# Patient Record
Sex: Male | Born: 1980 | Race: White | Hispanic: No | Marital: Single | Smoking: Never smoker
Health system: Southern US, Community
[De-identification: ages and names within clinical notes are randomized; demographics above are authoritative.]

## PROBLEM LIST (undated history)

## (undated) DIAGNOSIS — Z8619 Personal history of other infectious and parasitic diseases: Secondary | ICD-10-CM

## (undated) HISTORY — PX: WISDOM TOOTH EXTRACTION: SHX21

## (undated) HISTORY — DX: Personal history of other infectious and parasitic diseases: Z86.19

## (undated) HISTORY — PX: OTHER SURGICAL HISTORY: SHX169

---

## 2014-04-08 ENCOUNTER — Ambulatory Visit (HOSPITAL_BASED_OUTPATIENT_CLINIC_OR_DEPARTMENT_OTHER)
Admission: RE | Admit: 2014-04-08 | Discharge: 2014-04-08 | Disposition: A | Discharge status: Home / Self Care | Payer: BLUE CROSS/BLUE SHIELD | Source: Ambulatory Visit | Attending: Physician Assistant | Admitting: Physician Assistant

## 2014-04-08 ENCOUNTER — Encounter: Payer: Self-pay | Admitting: Physician Assistant

## 2014-04-08 ENCOUNTER — Ambulatory Visit (INDEPENDENT_AMBULATORY_CARE_PROVIDER_SITE_OTHER): Payer: BLUE CROSS/BLUE SHIELD | Admitting: Physician Assistant

## 2014-04-08 VITALS — BP 134/75 | HR 80 | Temp 98.1°F | Resp 16 | Ht 69.5 in | Wt 176.4 lb

## 2014-04-08 DIAGNOSIS — F1111 Opioid abuse, in remission: Secondary | ICD-10-CM

## 2014-04-08 DIAGNOSIS — M545 Low back pain, unspecified: Secondary | ICD-10-CM

## 2014-04-08 DIAGNOSIS — F1110 Opioid abuse, uncomplicated: Secondary | ICD-10-CM

## 2014-04-08 DIAGNOSIS — J00 Acute nasopharyngitis [common cold]: Secondary | ICD-10-CM

## 2014-04-08 DIAGNOSIS — J208 Acute bronchitis due to other specified organisms: Secondary | ICD-10-CM

## 2014-04-08 DIAGNOSIS — B9689 Other specified bacterial agents as the cause of diseases classified elsewhere: Secondary | ICD-10-CM

## 2014-04-08 DIAGNOSIS — M25531 Pain in right wrist: Secondary | ICD-10-CM | POA: Diagnosis not present

## 2014-04-08 DIAGNOSIS — G8929 Other chronic pain: Secondary | ICD-10-CM

## 2014-04-08 DIAGNOSIS — Z0000 Encounter for general adult medical examination without abnormal findings: Secondary | ICD-10-CM

## 2014-04-08 LAB — LIPID PANEL
CHOL/HDL RATIO: 3
CHOLESTEROL: 86 mg/dL (ref 0–200)
HDL: 28.3 mg/dL — AB (ref >39.00)
LDL Cholesterol: 36 mg/dL (ref 0–99)
NonHDL: 57.7
TRIGLYCERIDES: 111 mg/dL (ref 0.0–149.0)
VLDL: 22.2 mg/dL (ref 0.0–40.0)

## 2014-04-08 LAB — HEPATIC FUNCTION PANEL
ALT: 75 U/L — ABNORMAL HIGH (ref 0–53)
AST: 96 U/L — AB (ref 0–37)
Albumin: 4.2 g/dL (ref 3.5–5.2)
Alkaline Phosphatase: 54 U/L (ref 39–117)
BILIRUBIN TOTAL: 0.7 mg/dL (ref 0.2–1.2)
Bilirubin, Direct: 0.2 mg/dL (ref 0.0–0.3)
TOTAL PROTEIN: 6.3 g/dL (ref 6.0–8.3)

## 2014-04-08 LAB — CBC
HEMATOCRIT: 48.2 % (ref 39.0–52.0)
Hemoglobin: 16.5 g/dL (ref 13.0–17.0)
MCHC: 34.1 g/dL (ref 30.0–36.0)
MCV: 94.4 fl (ref 78.0–100.0)
Platelets: 224 10*3/uL (ref 150.0–400.0)
RBC: 5.11 Mil/uL (ref 4.22–5.81)
RDW: 14.6 % (ref 11.5–15.5)
WBC: 9.4 10*3/uL (ref 4.0–10.5)

## 2014-04-08 LAB — BASIC METABOLIC PANEL
BUN: 19 mg/dL (ref 6–23)
CO2: 31 mEq/L (ref 19–32)
CREATININE: 0.82 mg/dL (ref 0.40–1.50)
Calcium: 9.3 mg/dL (ref 8.4–10.5)
Chloride: 102 mEq/L (ref 96–112)
GFR: 113.23 mL/min (ref >60.00)
Glucose, Bld: 83 mg/dL (ref 70–99)
Potassium: 4.1 mEq/L (ref 3.5–5.1)
SODIUM: 138 meq/L (ref 135–145)

## 2014-04-08 LAB — TSH: TSH: 1.15 u[IU]/mL (ref 0.35–4.50)

## 2014-04-08 MED ORDER — AZITHROMYCIN 250 MG PO TABS: ORAL_TABLET | ORAL | Status: DC

## 2014-04-08 NOTE — Progress Notes (Signed)
Pre visit review using our clinic review tool, if applicable. No additional management support is needed unless otherwise documented below in the visit note/SLS  

## 2014-04-08 NOTE — Progress Notes (Signed)
Patient presents to clinic today to establish care. Patient requesting CPE and also has acute concerns.  Patient is fasting for labs.  Acute Concerns: Patient complains of intermittent low back pain over the past several months after sustaining an injury in prison.  Patient states pain has not kept him from weightlifting daily. Denies radiation of pain into extremities.  Denies weakness, numbness or tingling of extremities.  Patient also complains of chronic pain of R hand.  Patient sustained significant injury to hand in 2009.  Patient is s/p hand surgery with grafting.  Endorses popping and creaking of wrist and hand since that time with occasional pain. Denies numbness or weakness of hand.  Endorses good drip strength.  Patient also complains of chest congestion and productive cough x 2-3 weeks.  Denies fever, chills or SOB.  Denies pleuritic chest pain.  Is a smoker.   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  Is doing ok overall.  Is trying to avoid situations and people that help perpetuate medication abuse.  Is going to church and feels he has a good support system in place.  Health Maintenance: Dental -- up-to-date Vision -- up-to-date Immunizations -- Declines flu shot.  Tetanus up-to-date 2010.  Past Medical History  Diagnosis Date  . History of chicken pox     Past Surgical History  Procedure Laterality Date  . Wisdom tooth extraction    . Groin flap      No current outpatient prescriptions on file prior to visit.   No current facility-administered medications on file prior to visit.    No Known Allergies  Family History  Problem Relation Age of Onset  . Cancer Mother     Living -- Unsure of type    History   Social History  . Marital Status: Single    Spouse Name: N/A    Number of Children: N/A  . Years of Education: N/A   Occupational History  . Not on file.   Social History Main Topics  . Smoking  status: Never Smoker   . Smokeless tobacco: Never Used  . Alcohol Use: No  . Drug Use: No  . Sexual Activity:    Partners: Female   Other Topics Concern  . Not on file   Social History Narrative   Review of Systems  Constitutional: Negative for fever and weight loss.  HENT: Negative for ear discharge, ear pain, hearing loss and tinnitus.   Eyes: Negative for blurred vision, double vision, photophobia and pain.  Respiratory: Negative for cough and shortness of breath.   Cardiovascular: Negative for chest pain and palpitations.  Gastrointestinal: Negative for heartburn, nausea, vomiting, abdominal pain, diarrhea, constipation, blood in stool and melena.  Genitourinary: Negative for dysuria, urgency, frequency, hematuria and flank pain.  Musculoskeletal: Negative for falls.  Neurological: Negative for dizziness, loss of consciousness and headaches.  Endo/Heme/Allergies: Negative for environmental allergies.  Psychiatric/Behavioral: Negative for depression, suicidal ideas, hallucinations and substance abuse. The patient is not nervous/anxious and does not have insomnia.    BP 134/75 mmHg  Pulse 80  Temp(Src) 98.1 F (36.7 C) (Oral)  Resp 16  Ht 5' 9.5" (1.765 m)  Wt 176 lb 6 oz (80.003 kg)  BMI 25.68 kg/m2  SpO2 98%  Physical Exam  Constitutional: He is oriented to person, place, and time and well-developed, well-nourished, and in no distress.  HENT:  Head: Normocephalic and atraumatic.  Right Ear: External ear normal.  Left Ear: External  ear normal.  Nose: Nose normal.  Mouth/Throat: Oropharynx is clear and moist. No oropharyngeal exudate.  Eyes: Conjunctivae and EOM are normal. Pupils are equal, round, and reactive to light.  Neck: Neck supple. No thyromegaly present.  Cardiovascular: Normal rate, regular rhythm, normal heart sounds and intact distal pulses.   Pulmonary/Chest: Effort normal and breath sounds normal. No respiratory distress. He has no wheezes. He has no  rales. He exhibits no tenderness.  Abdominal: Soft. Bowel sounds are normal. He exhibits no distension and no mass. There is no tenderness. There is no rebound and no guarding.  Genitourinary: Testes/scrotum normal and penis normal. No discharge found.  Musculoskeletal:       Lumbar back: He exhibits tenderness. He exhibits normal range of motion, no bony tenderness, no pain and no spasm.       Right hand: He exhibits deformity. He exhibits normal range of motion, no tenderness and normal capillary refill. Normal sensation noted. Normal strength noted.  Lymphadenopathy:    He has no cervical adenopathy.  Neurological: He is alert and oriented to person, place, and time.  Skin: Skin is warm and dry. No rash noted.  Psychiatric: Affect normal.  Vitals reviewed.  Assessment/Plan: Visit for preventive health examination I have reviewed the patient's medical history in detail and updated the computerized patient record.  Patient declines flu shot.  Endorses Tetanus up-to-date.  Will obtain fasting labs today. Preventive care discussed with patient.  Handout given.   Acute bacterial bronchitis Rx Azithromycin. Increase fluids.  Rest. Mucinex-DM for cough and congestion. Place a humidifier in the bedroom.  Return precautions discussed with patient.   Low back pain of over 3 months duration Will obtain x-ray of lumbar spine to further assess.  Encouraged daily Aleve. Avoid heavy lifting until results can be discussed.  Giving history, will likely need Orthopedic input.   Wrist pain, chronic Will obtain x-ray of R wrist today.  Giving history of significant trauma and surgery, the crepitus is likely secondary to chronic OA.  Supportive measures discussed.  Patient with opioid abuse so narcotics will never been an option with patient at this clinic.  Recommend Hand specialist input.   Opioid abuse, in remission Patient doing well. Will mark chart that patient is a controlled medication caution.   Will not prescribed narcotics to this patient.

## 2014-04-08 NOTE — Patient Instructions (Signed)
Please take antibiotic as directed. Stay well hydrated.  Use Delsym for cough.  Put a humidifier in the bedroom.  Call or return if respiratory symptoms are not improving.  Please stop by the lab for blood work.  I will call you with your results.    Please consider calling the number for Counseling services to set up an appointment with Karna Christmas of [facility] for counseling  Preventive Care for Adults A healthy lifestyle and preventive care can promote health and wellness. Preventive health guidelines for men include the following key practices:  A routine yearly physical is a good way to check with your health care provider about your health and preventative screening. It is a chance to share any concerns and updates on your health and to receive a thorough exam.  Visit your dentist for a routine exam and preventative care every 6 months. Brush your teeth twice a day and floss once a day. Good oral hygiene prevents tooth decay and gum disease.  The frequency of eye exams is based on your age, health, family medical history, use of contact lenses, and other factors. Follow your health care provider's recommendations for frequency of eye exams.  Eat a healthy diet. Foods such as vegetables, fruits, whole grains, low-fat dairy products, and lean protein foods contain the nutrients you need without too many calories. Decrease your intake of foods high in solid fats, added sugars, and salt. Eat the right amount of calories for you.Get information about a proper diet from your health care provider, if necessary.  Regular physical exercise is one of the most important things you can do for your health. Most adults should get at least 150 minutes of moderate-intensity exercise (any activity that increases your heart rate and causes you to sweat) each week. In addition, most adults need muscle-strengthening exercises on 2 or more days a week.  Maintain a healthy weight. The body mass index (BMI) is a screening  tool to identify possible weight problems. It provides an estimate of body fat based on height and weight. Your health care provider can find your BMI and can help you achieve or maintain a healthy weight.For adults 20 years and older:  A BMI below 18.5 is considered underweight.  A BMI of 18.5 to 24.9 is normal.  A BMI of 25 to 29.9 is considered overweight.  A BMI of 30 and above is considered obese.  Maintain normal blood lipids and cholesterol levels by exercising and minimizing your intake of saturated fat. Eat a balanced diet with plenty of fruit and vegetables. Blood tests for lipids and cholesterol should begin at age 30 and be repeated every 5 years. If your lipid or cholesterol levels are high, you are over 50, or you are at high risk for heart disease, you may need your cholesterol levels checked more frequently.Ongoing high lipid and cholesterol levels should be treated with medicines if diet and exercise are not working.  If you smoke, find out from your health care provider how to quit. If you do not use tobacco, do not start.  Lung cancer screening is recommended for adults aged 34-80 years who are at high risk for developing lung cancer because of a history of smoking. A yearly low-dose CT scan of the lungs is recommended for people who have at least a 30-pack-year history of smoking and are a current smoker or have quit within the past 15 years. A pack year of smoking is smoking an average of 1 pack of cigarettes  a day for 1 year (for example: 1 pack a day for 30 years or 2 packs a day for 15 years). Yearly screening should continue until the smoker has stopped smoking for at least 15 years. Yearly screening should be stopped for people who develop a health problem that would prevent them from having lung cancer treatment.  If you choose to drink alcohol, do not have more than 2 drinks per day. One drink is considered to be 12 ounces (355 mL) of beer, 5 ounces (148 mL) of wine, or  1.5 ounces (44 mL) of liquor.  Avoid use of street drugs. Do not share needles with anyone. Ask for help if you need support or instructions about stopping the use of drugs.  High blood pressure causes heart disease and increases the risk of stroke. Your blood pressure should be checked at least every 1-2 years. Ongoing high blood pressure should be treated with medicines, if weight loss and exercise are not effective.  If you are 107-75 years old, ask your health care provider if you should take aspirin to prevent heart disease.  Diabetes screening involves taking a blood sample to check your fasting blood sugar level. This should be done once every 3 years, after age 33, if you are within normal weight and without risk factors for diabetes. Testing should be considered at a younger age or be carried out more frequently if you are overweight and have at least 1 risk factor for diabetes.  Colorectal cancer can be detected and often prevented. Most routine colorectal cancer screening begins at the age of 18 and continues through age 29. However, your health care provider may recommend screening at an earlier age if you have risk factors for colon cancer. On a yearly basis, your health care provider may provide home test kits to check for hidden blood in the stool. Use of a small camera at the end of a tube to directly examine the colon (sigmoidoscopy or colonoscopy) can detect the earliest forms of colorectal cancer. Talk to your health care provider about this at age 83, when routine screening begins. Direct exam of the colon should be repeated every 5-10 years through age 29, unless early forms of precancerous polyps or small growths are found.  People who are at an increased risk for hepatitis B should be screened for this virus. You are considered at high risk for hepatitis B if:  You were born in a country where hepatitis B occurs often. Talk with your health care provider about which countries are  considered high risk.  Your parents were born in a high-risk country and you have not received a shot to protect against hepatitis B (hepatitis B vaccine).  You have HIV or AIDS.  You use needles to inject street drugs.  You live with, or have sex with, someone who has hepatitis B.  You are a man who has sex with other men (MSM).  You get hemodialysis treatment.  You take certain medicines for conditions such as cancer, organ transplantation, and autoimmune conditions.  Hepatitis C blood testing is recommended for all people born from [birth year] through 1965 and any individual with known risks for hepatitis C.  Practice safe sex. Use condoms and avoid high-risk sexual practices to reduce the spread of sexually transmitted infections (STIs). STIs include gonorrhea, chlamydia, syphilis, trichomonas, herpes, HPV, and human immunodeficiency virus (HIV). Herpes, HIV, and HPV are viral illnesses that have no cure. They can result in disability, cancer, and death.  If you are at risk of being infected with HIV, it is recommended that you take a prescription medicine daily to prevent HIV infection. This is called preexposure prophylaxis (PrEP). You are considered at risk if:  You are a man who has sex with other men (MSM) and have other risk factors.  You are a heterosexual man, are sexually active, and are at increased risk for HIV infection.  You take drugs by injection.  You are sexually active with a partner who has HIV.  Talk with your health care provider about whether you are at high risk of being infected with HIV. If you choose to begin PrEP, you should first be tested for HIV. You should then be tested every 3 months for as long as you are taking PrEP.  A one-time screening for abdominal aortic aneurysm (AAA) and surgical repair of large AAAs by ultrasound are recommended for men ages 18 to 34 years who are current or former smokers.  Healthy men should no longer receive  prostate-specific antigen (PSA) blood tests as part of routine cancer screening. Talk with your health care provider about prostate cancer screening.  Testicular cancer screening is not recommended for adult males who have no symptoms. Screening includes self-exam, a health care provider exam, and other screening tests. Consult with your health care provider about any symptoms you have or any concerns you have about testicular cancer.  Use sunscreen. Apply sunscreen liberally and repeatedly throughout the day. You should seek shade when your shadow is shorter than you. Protect yourself by wearing long sleeves, pants, a wide-brimmed hat, and sunglasses year round, whenever you are outdoors.  Once a month, do a whole-body skin exam, using a mirror to look at the skin on your back. Tell your health care provider about new moles, moles that have irregular borders, moles that are larger than a pencil eraser, or moles that have changed in shape or color.  Stay current with required vaccines (immunizations).  Influenza vaccine. All adults should be immunized every year.  Tetanus, diphtheria, and acellular pertussis (Td, Tdap) vaccine. An adult who has not previously received Tdap or who does not know his vaccine status should receive 1 dose of Tdap. This initial dose should be followed by tetanus and diphtheria toxoids (Td) booster doses every 10 years. Adults with an unknown or incomplete history of completing a 3-dose immunization series with Td-containing vaccines should begin or complete a primary immunization series including a Tdap dose. Adults should receive a Td booster every 10 years.  Varicella vaccine. An adult without evidence of immunity to varicella should receive 2 doses or a second dose if he has previously received 1 dose.  Human papillomavirus (HPV) vaccine. Males aged 25-21 years who have not received the vaccine previously should receive the 3-dose series. Males aged 22-26 years may be  immunized. Immunization is recommended through the age of 53 years for any male who has sex with males and did not get any or all doses earlier. Immunization is recommended for any person with an immunocompromised condition through the age of 67 years if he did not get any or all doses earlier. During the 3-dose series, the second dose should be obtained 4-8 weeks after the first dose. The third dose should be obtained 24 weeks after the first dose and 16 weeks after the second dose.  Zoster vaccine. One dose is recommended for adults aged 27 years or older unless certain conditions are present.  Measles, mumps, and rubella (MMR)  vaccine. Adults born before 25 generally are considered immune to measles and mumps. Adults born in [birth year] or later should have 1 or more doses of MMR vaccine unless there is a contraindication to the vaccine or there is laboratory evidence of immunity to each of the three diseases. A routine second dose of MMR vaccine should be obtained at least 28 days after the first dose for students attending postsecondary schools, health care workers, or international travelers. People who received inactivated measles vaccine or an unknown type of measles vaccine during 1963-1967 should receive 2 doses of MMR vaccine. People who received inactivated mumps vaccine or an unknown type of mumps vaccine before 1979 and are at high risk for mumps infection should consider immunization with 2 doses of MMR vaccine. Unvaccinated health care workers born before 60 who lack laboratory evidence of measles, mumps, or rubella immunity or laboratory confirmation of disease should consider measles and mumps immunization with 2 doses of MMR vaccine or rubella immunization with 1 dose of MMR vaccine.  Pneumococcal 13-valent conjugate (PCV13) vaccine. When indicated, a person who is uncertain of his immunization history and has no record of immunization should receive the PCV13 vaccine. An adult aged 74 years or  older who has certain medical conditions and has not been previously immunized should receive 1 dose of PCV13 vaccine. This PCV13 should be followed with a dose of pneumococcal polysaccharide (PPSV23) vaccine. The PPSV23 vaccine dose should be obtained at least 8 weeks after the dose of PCV13 vaccine. An adult aged 50 years or older who has certain medical conditions and previously received 1 or more doses of PPSV23 vaccine should receive 1 dose of PCV13. The PCV13 vaccine dose should be obtained 1 or more years after the last PPSV23 vaccine dose.  Pneumococcal polysaccharide (PPSV23) vaccine. When PCV13 is also indicated, PCV13 should be obtained first. All adults aged 33 years and older should be immunized. An adult younger than age 31 years who has certain medical conditions should be immunized. Any person who resides in a nursing home or long-term care facility should be immunized. An adult smoker should be immunized. People with an immunocompromised condition and certain other conditions should receive both PCV13 and PPSV23 vaccines. People with human immunodeficiency virus (HIV) infection should be immunized as soon as possible after diagnosis. Immunization during chemotherapy or radiation therapy should be avoided. Routine use of PPSV23 vaccine is not recommended for American Indians, [place] Natives, or people younger than 65 years unless there are medical conditions that require PPSV23 vaccine. When indicated, people who have unknown immunization and have no record of immunization should receive PPSV23 vaccine. One-time revaccination 5 years after the first dose of PPSV23 is recommended for people aged 19-64 years who have chronic kidney failure, nephrotic syndrome, asplenia, or immunocompromised conditions. People who received 1-2 doses of PPSV23 before age 7 years should receive another dose of PPSV23 vaccine at age 52 years or later if at least 5 years have passed since the previous dose. Doses of  PPSV23 are not needed for people immunized with PPSV23 at or after age 64 years.  Meningococcal vaccine. Adults with asplenia or persistent complement component deficiencies should receive 2 doses of quadrivalent meningococcal conjugate (MenACWY-D) vaccine. The doses should be obtained at least 2 months apart. Microbiologists working with certain meningococcal bacteria, [place] recruits, people at risk during an outbreak, and people who travel to or live in countries with a high rate of meningitis should be immunized. A Market researcher up  through age 64 years who is living in a residence hall should receive a dose if he did not receive a dose on or after his 16th birthday. Adults who have certain high-risk conditions should receive one or more doses of vaccine.  Hepatitis A vaccine. Adults who wish to be protected from this disease, have certain high-risk conditions, work with hepatitis A-infected animals, work in hepatitis A research labs, or travel to or work in countries with a high rate of hepatitis A should be immunized. Adults who were previously unvaccinated and who anticipate close contact with an international adoptee during the first 60 days after arrival in the [place] from a country with a high rate of hepatitis A should be immunized.  Hepatitis B vaccine. Adults should be immunized if they wish to be protected from this disease, have certain high-risk conditions, may be exposed to blood or other infectious body fluids, are household contacts or sex partners of hepatitis B positive people, are clients or workers in certain care facilities, or travel to or work in countries with a high rate of hepatitis B.  Haemophilus influenzae type b (Hib) vaccine. A previously unvaccinated person with asplenia or sickle cell disease or having a scheduled splenectomy should receive 1 dose of Hib vaccine. Regardless of previous immunization, a recipient of a hematopoietic stem cell transplant  should receive a 3-dose series 6-12 months after his successful transplant. Hib vaccine is not recommended for adults with HIV infection. Preventive Service / Frequency Ages 23 to 52  Blood pressure check.** / Every 1 to 2 years.  Lipid and cholesterol check.** / Every 5 years beginning at age 39.  Hepatitis C blood test.** / For any individual with known risks for hepatitis C.  Skin self-exam. / Monthly.  Influenza vaccine. / Every year.  Tetanus, diphtheria, and acellular pertussis (Tdap, Td) vaccine.** / Consult your health care provider. 1 dose of Td every 10 years.  Varicella vaccine.** / Consult your health care provider.  HPV vaccine. / 3 doses over 6 months, if 22 or younger.  Measles, mumps, rubella (MMR) vaccine.** / You need at least 1 dose of MMR if you were born in 1957 or later. You may also need a second dose.  Pneumococcal 13-valent conjugate (PCV13) vaccine.** / Consult your health care provider.  Pneumococcal polysaccharide (PPSV23) vaccine.** / 1 to 2 doses if you smoke cigarettes or if you have certain conditions.  Meningococcal vaccine.** / 1 dose if you are age 30 to 36 years and a Market researcher living in a residence hall, or have one of several medical conditions. You may also need additional booster doses.  Hepatitis A vaccine.** / Consult your health care provider.  Hepatitis B vaccine.** / Consult your health care provider.  Haemophilus influenzae type b (Hib) vaccine.** / Consult your health care provider. Ages 34 to 25  Blood pressure check.** / Every 1 to 2 years.  Lipid and cholesterol check.** / Every 5 years beginning at age 26.  Lung cancer screening. / Every year if you are aged 13-80 years and have a 30-pack-year history of smoking and currently smoke or have quit within the past 15 years. Yearly screening is stopped once you have quit smoking for at least 15 years or develop a health problem that would prevent you from having  lung cancer treatment.  Fecal occult blood test (FOBT) of stool. / Every year beginning at age 54 and continuing until age 39. You may not have to do this  test if you get a colonoscopy every 10 years.  Flexible sigmoidoscopy** or colonoscopy.** / Every 5 years for a flexible sigmoidoscopy or every 10 years for a colonoscopy beginning at age 88 and continuing until age 59.  Hepatitis C blood test.** / For all people born from [birth year] through 1965 and any individual with known risks for hepatitis C.  Skin self-exam. / Monthly.  Influenza vaccine. / Every year.  Tetanus, diphtheria, and acellular pertussis (Tdap/Td) vaccine.** / Consult your health care provider. 1 dose of Td every 10 years.  Varicella vaccine.** / Consult your health care provider.  Zoster vaccine.** / 1 dose for adults aged 96 years or older.  Measles, mumps, rubella (MMR) vaccine.** / You need at least 1 dose of MMR if you were born in 1959 or later. You may also need a second dose.  Pneumococcal 13-valent conjugate (PCV13) vaccine.** / Consult your health care provider.  Pneumococcal polysaccharide (PPSV23) vaccine.** / 1 to 2 doses if you smoke cigarettes or if you have certain conditions.  Meningococcal vaccine.** / Consult your health care provider.  Hepatitis A vaccine.** / Consult your health care provider.  Hepatitis B vaccine.** / Consult your health care provider.  Haemophilus influenzae type b (Hib) vaccine.** / Consult your health care provider. Ages 25 and over  Blood pressure check.** / Every 1 to 2 years.  Lipid and cholesterol check.**/ Every 5 years beginning at age 46.  Lung cancer screening. / Every year if you are aged 29-80 years and have a 30-pack-year history of smoking and currently smoke or have quit within the past 15 years. Yearly screening is stopped once you have quit smoking for at least 15 years or develop a health problem that would prevent you from having lung cancer  treatment.  Fecal occult blood test (FOBT) of stool. / Every year beginning at age 29 and continuing until age 47. You may not have to do this test if you get a colonoscopy every 10 years.  Flexible sigmoidoscopy** or colonoscopy.** / Every 5 years for a flexible sigmoidoscopy or every 10 years for a colonoscopy beginning at age 7 and continuing until age 45.  Hepatitis C blood test.** / For all people born from [birth year] through 1967 and any individual with known risks for hepatitis C.  Abdominal aortic aneurysm (AAA) screening.** / A one-time screening for ages 22 to 78 years who are current or former smokers.  Skin self-exam. / Monthly.  Influenza vaccine. / Every year.  Tetanus, diphtheria, and acellular pertussis (Tdap/Td) vaccine.** / 1 dose of Td every 10 years.  Varicella vaccine.** / Consult your health care provider.  Zoster vaccine.** / 1 dose for adults aged 86 years or older.  Pneumococcal 13-valent conjugate (PCV13) vaccine.** / Consult your health care provider.  Pneumococcal polysaccharide (PPSV23) vaccine.** / 1 dose for all adults aged 37 years and older.  Meningococcal vaccine.** / Consult your health care provider.  Hepatitis A vaccine.** / Consult your health care provider.  Hepatitis B vaccine.** / Consult your health care provider.  Haemophilus influenzae type b (Hib) vaccine.** / Consult your health care provider. **Family history and personal history of risk and conditions may change your health care provider's recommendations. Document Released: 04/16/2001 Document Revised: 02/23/2013 Document Reviewed: 07/16/2010 [facility] Patient Information 2015 [place], [place]. This information is not intended to replace advice given to you by your health care provider. Make sure you discuss any questions you have with your health care provider.

## 2014-04-12 ENCOUNTER — Telehealth: Payer: Self-pay | Admitting: Physician Assistant

## 2014-04-12 DIAGNOSIS — B9689 Other specified bacterial agents as the cause of diseases classified elsewhere: Secondary | ICD-10-CM | POA: Insufficient documentation

## 2014-04-12 DIAGNOSIS — M545 Low back pain, unspecified: Secondary | ICD-10-CM | POA: Insufficient documentation

## 2014-04-12 DIAGNOSIS — F1111 Opioid abuse, in remission: Secondary | ICD-10-CM | POA: Insufficient documentation

## 2014-04-12 DIAGNOSIS — M25539 Pain in unspecified wrist: Secondary | ICD-10-CM

## 2014-04-12 DIAGNOSIS — G8929 Other chronic pain: Secondary | ICD-10-CM | POA: Insufficient documentation

## 2014-04-12 DIAGNOSIS — J208 Acute bronchitis due to other specified organisms: Secondary | ICD-10-CM

## 2014-04-12 DIAGNOSIS — Z0000 Encounter for general adult medical examination without abnormal findings: Secondary | ICD-10-CM | POA: Insufficient documentation

## 2014-04-12 NOTE — Progress Notes (Signed)
Erroneous encounter- duplicate 

## 2014-04-12 NOTE — Assessment & Plan Note (Signed)
Rx Azithromycin. Increase fluids.  Rest. Mucinex-DM for cough and congestion. Place a humidifier in the bedroom.  Return precautions discussed with patient.

## 2014-04-12 NOTE — Patient Instructions (Signed)
Duplicate encounter

## 2014-04-12 NOTE — Assessment & Plan Note (Signed)
Will obtain x-ray of R wrist today.  Giving history of significant trauma and surgery, the crepitus is likely secondary to chronic OA.  Supportive measures discussed.  Patient with opioid abuse so narcotics will never been an option with patient at this clinic.  Recommend Hand specialist input.

## 2014-04-12 NOTE — Assessment & Plan Note (Signed)
I have reviewed the patient's medical history in detail and updated the computerized patient record.  Patient declines flu shot.  Endorses Tetanus up-to-date.  Will obtain fasting labs today. Preventive care discussed with patient.  Handout given.

## 2014-04-12 NOTE — Telephone Encounter (Signed)
Caller name: Ivin BootyJoshua Relation to pt: Call back number:[phone] Pharmacy:  Reason for call: Pt was returning call. Pt states if calling back can leave message on cell phone, you can return call for tomorrow. Please advise

## 2014-04-12 NOTE — Assessment & Plan Note (Signed)
Patient doing well. Will mark chart that patient is a controlled medication caution.  Will not prescribed narcotics to this patient.

## 2014-04-12 NOTE — Assessment & Plan Note (Signed)
Will obtain x-ray of lumbar spine to further assess.  Encouraged daily Aleve. Avoid heavy lifting until results can be discussed.  Giving history, will likely need Orthopedic input.

## 2014-04-13 ENCOUNTER — Telehealth: Payer: Self-pay | Admitting: *Deleted

## 2014-04-13 DIAGNOSIS — G8929 Other chronic pain: Secondary | ICD-10-CM

## 2014-04-13 DIAGNOSIS — R7989 Other specified abnormal findings of blood chemistry: Secondary | ICD-10-CM

## 2014-04-13 DIAGNOSIS — M25539 Pain in unspecified wrist: Secondary | ICD-10-CM

## 2014-04-13 DIAGNOSIS — M545 Low back pain, unspecified: Secondary | ICD-10-CM

## 2014-04-13 DIAGNOSIS — R945 Abnormal results of liver function studies: Secondary | ICD-10-CM

## 2014-04-13 NOTE — Telephone Encounter (Signed)
Labs look good overall. His liver enzymes are elevated -- He is to avoid tylenol products and alcohol, as this elevation could be due to those things. Want him to return to lab in 1 week for -- CMP, Acute Hepatitis Panel. Dx: Elevated liver enzymes. If still elevated, will need imaging of his liver. Patient informed, understood & agreed; future lab orders placed and lab scheduled for Friday morning/[facility] Ortho referral entered, as patient is anxious/SLS

## 2014-04-13 NOTE — Telephone Encounter (Signed)
-----   Message from Waldon MerlWilliam C Martin, PA-C sent at 04/08/2014 11:31 AM EST ----- Xray results are in:   (1) Lumbar Spine -- reveals mild slipped disc in the lumbar spine at L4-L5 which is likely cause of his intermittent pains and spasms.  Continue care as directed at today's visit.  I want him to consider letting us set him up with a Orthopedic Specialist.   (2) R Wrist -- significant arthritic changes due to his history of injury and surgery.  No acute fracture noted.  Again would recommend specialist if pain is present.  Otherwise no further workup needed at present.

## 2014-04-13 NOTE — Telephone Encounter (Signed)
Message Copied to Result Note/SLS

## 2014-04-15 ENCOUNTER — Other Ambulatory Visit (INDEPENDENT_AMBULATORY_CARE_PROVIDER_SITE_OTHER): Payer: BLUE CROSS/BLUE SHIELD

## 2014-04-15 DIAGNOSIS — R7989 Other specified abnormal findings of blood chemistry: Secondary | ICD-10-CM

## 2014-04-15 DIAGNOSIS — R945 Abnormal results of liver function studies: Principal | ICD-10-CM

## 2014-04-15 LAB — COMPREHENSIVE METABOLIC PANEL
ALT: 61 U/L — ABNORMAL HIGH (ref 0–53)
AST: 56 U/L — ABNORMAL HIGH (ref 0–37)
Albumin: 4.2 g/dL (ref 3.5–5.2)
Alkaline Phosphatase: 55 U/L (ref 39–117)
BILIRUBIN TOTAL: 0.8 mg/dL (ref 0.2–1.2)
BUN: 15 mg/dL (ref 6–23)
CO2: 32 meq/L (ref 19–32)
CREATININE: 0.86 mg/dL (ref 0.40–1.50)
Calcium: 9.6 mg/dL (ref 8.4–10.5)
Chloride: 104 mEq/L (ref 96–112)
GFR: 107.17 mL/min (ref >60.00)
GLUCOSE: 93 mg/dL (ref 70–99)
Potassium: 3.8 mEq/L (ref 3.5–5.1)
Sodium: 140 mEq/L (ref 135–145)
Total Protein: 6.4 g/dL (ref 6.0–8.3)

## 2014-04-19 NOTE — Addendum Note (Signed)
Addended by: Eustace QuailEABOLD, Breeona Waid J on: 04/19/2014 03:25 PM   Modules accepted: Orders

## 2014-04-19 NOTE — Addendum Note (Signed)
Addended by: Eustace QuailEABOLD, Jocelin Schuelke J on: 04/19/2014 03:24 PM   Modules accepted: Orders

## 2014-04-20 LAB — ACUTE HEP PANEL AND HEP B SURFACE AB
HCV AB: NEGATIVE
HEP B C IGM: NONREACTIVE
HEP B S AB: NEGATIVE
Hep A IgM: NONREACTIVE
Hepatitis B Surface Ag: NEGATIVE

## 2014-04-22 ENCOUNTER — Ambulatory Visit: Payer: BLUE CROSS/BLUE SHIELD | Admitting: Physician Assistant

## 2014-04-25 ENCOUNTER — Ambulatory Visit: Payer: BLUE CROSS/BLUE SHIELD | Admitting: Physician Assistant

## 2014-04-27 ENCOUNTER — Ambulatory Visit (INDEPENDENT_AMBULATORY_CARE_PROVIDER_SITE_OTHER): Payer: BLUE CROSS/BLUE SHIELD | Admitting: Physician Assistant

## 2014-04-27 ENCOUNTER — Encounter: Payer: Self-pay | Admitting: Physician Assistant

## 2014-04-27 VITALS — BP 140/93 | HR 100 | Temp 97.8°F | Resp 16 | Ht 69.5 in | Wt 171.5 lb

## 2014-04-27 DIAGNOSIS — H66002 Acute suppurative otitis media without spontaneous rupture of ear drum, left ear: Secondary | ICD-10-CM

## 2014-04-27 MED ORDER — FLUTICASONE PROPIONATE 50 MCG/ACT NA SUSP: 2.0000 | Freq: Every day | NASAL | Status: AC

## 2014-04-27 MED ORDER — AMOXICILLIN 400 MG/5ML PO SUSR: 500.0000 mg | Freq: Three times a day (TID) | ORAL | Status: AC

## 2014-04-27 NOTE — Progress Notes (Signed)
Pre visit review using our clinic review tool, if applicable. No additional management support is needed unless otherwise documented below in the visit note/SLS  

## 2014-04-28 DIAGNOSIS — H66009 Acute suppurative otitis media without spontaneous rupture of ear drum, unspecified ear: Secondary | ICD-10-CM | POA: Insufficient documentation

## 2014-04-28 NOTE — Assessment & Plan Note (Signed)
Rx Amoxicillin suspension as patient refusing tablet.  Rx Flonase.  Supportive measures discussed with patient.

## 2014-04-28 NOTE — Progress Notes (Signed)
Patient presents to clinic today c/o continued left ear pain and pressure despite being treated for bronchitis and AOM with Azithromycin.  Denies new or worsening symptoms.  Has not tried anything else for symptoms.  Past Medical History  Diagnosis Date  . History of chicken pox     No current outpatient prescriptions on file prior to visit.   No current facility-administered medications on file prior to visit.    No Known Allergies  Family History  Problem Relation Age of Onset  . Cancer Mother     Living -- Unsure of type    History   Social History  . Marital Status: Single    Spouse Name: N/A  . Number of Children: N/A  . Years of Education: N/A   Social History Main Topics  . Smoking status: Never Smoker   . Smokeless tobacco: Never Used  . Alcohol Use: No  . Drug Use: No  . Sexual Activity:    Partners: Female   Other Topics Concern  . None   Social History Narrative    Review of Systems - See HPI.  All other ROS are negative.  BP 140/93 mmHg  Pulse 100  Temp(Src) 97.8 F (36.6 C) (Oral)  Resp 16  Ht 5' 9.5" (1.765 m)  Wt 171 lb 8 oz (77.792 kg)  BMI 24.97 kg/m2  SpO2 98%  Physical Exam  Constitutional: He is oriented to person, place, and time and well-developed, well-nourished, and in no distress.  HENT:  Head: Normocephalic and atraumatic.  Right Ear: Tympanic membrane, external ear and ear canal normal.  Left Ear: External ear and ear canal normal. Tympanic membrane is erythematous and bulging.  Nose: Nose normal.  Mouth/Throat: Uvula is midline, oropharynx is clear and moist and mucous membranes are normal.  Cardiovascular: Normal rate, regular rhythm, normal heart sounds and intact distal pulses.   Pulmonary/Chest: Effort normal and breath sounds normal. No respiratory distress. He has no wheezes. He has no rales. He exhibits no tenderness.  Neurological: He is alert and oriented to person, place, and time.  Skin: Skin is warm and  dry. No rash noted.  Psychiatric: Affect normal.  Vitals reviewed.   Recent Results (from the past 2160 hour(s))  Hepatic function panel     Status: Abnormal   Collection Time: 04/08/14 10:30 AM  Result Value Ref Range   Total Bilirubin 0.7 0.2 - 1.2 mg/dL   Bilirubin, Direct 0.2 0.0 - 0.3 mg/dL   Alkaline Phosphatase 54 39 - 117 U/L   AST 96 (H) 0 - 37 U/L   ALT 75 (H) 0 - 53 U/L   Total Protein 6.3 6.0 - 8.3 g/dL   Albumin 4.2 3.5 - 5.2 g/dL  Lipid panel     Status: Abnormal   Collection Time: 04/08/14 10:30 AM  Result Value Ref Range   Cholesterol 86 0 - 200 mg/dL    Comment: ATP III Classification       Desirable:  < 200 mg/dL               Borderline High:  200 - 239 mg/dL          High:  > = 240 mg/dL   Triglycerides 111.0 0.0 - 149.0 mg/dL    Comment: Normal:  <150 mg/dLBorderline High:  150 - 199 mg/dL   HDL 28.30 (L) >39.00 mg/dL   VLDL 22.2 0.0 - 40.0 mg/dL   LDL Cholesterol 36 0 - 99 mg/dL  Total CHOL/HDL Ratio 3     Comment:                Men          Women1/2 Average Risk     3.4          3.3Average Risk          5.0          4.42X Average Risk          9.6          7.13X Average Risk          15.0          11.0                       NonHDL 57.70     Comment: NOTE:  Non-HDL goal should be 30 mg/dL higher than patient's LDL goal (i.e. LDL goal of < 70 mg/dL, would have non-HDL goal of < 100 mg/dL)  TSH     Status: None   Collection Time: 04/08/14 10:30 AM  Result Value Ref Range   TSH 1.15 0.35 - 4.50 uIU/mL  Basic Metabolic Panel (BMET)     Status: None   Collection Time: 04/08/14 10:30 AM  Result Value Ref Range   Sodium 138 135 - 145 mEq/L   Potassium 4.1 3.5 - 5.1 mEq/L   Chloride 102 96 - 112 mEq/L   CO2 31 19 - 32 mEq/L   Glucose, Bld 83 70 - 99 mg/dL   BUN 19 6 - 23 mg/dL   Creatinine, Ser 0.82 0.40 - 1.50 mg/dL   Calcium 9.3 8.4 - 10.5 mg/dL   GFR 113.23 >60.00 mL/min  CBC     Status: None   Collection Time: 04/08/14 10:30 AM  Result Value Ref  Range   WBC 9.4 4.0 - 10.5 K/uL   RBC 5.11 4.22 - 5.81 Mil/uL   Platelets 224.0 150.0 - 400.0 K/uL   Hemoglobin 16.5 13.0 - 17.0 g/dL   HCT 48.2 39.0 - 52.0 %   MCV 94.4 78.0 - 100.0 fl   MCHC 34.1 30.0 - 36.0 g/dL   RDW 14.6 11.5 - 15.5 %  Comp Met (CMET)     Status: Abnormal   Collection Time: 04/15/14  9:57 AM  Result Value Ref Range   Sodium 140 135 - 145 mEq/L   Potassium 3.8 3.5 - 5.1 mEq/L   Chloride 104 96 - 112 mEq/L   CO2 32 19 - 32 mEq/L   Glucose, Bld 93 70 - 99 mg/dL   BUN 15 6 - 23 mg/dL   Creatinine, Ser 0.86 0.40 - 1.50 mg/dL   Total Bilirubin 0.8 0.2 - 1.2 mg/dL   Alkaline Phosphatase 55 39 - 117 U/L   AST 56 (H) 0 - 37 U/L   ALT 61 (H) 0 - 53 U/L   Total Protein 6.4 6.0 - 8.3 g/dL   Albumin 4.2 3.5 - 5.2 g/dL   Calcium 9.6 8.4 - 10.5 mg/dL   GFR 107.17 >60.00 mL/min  Acute Hep Panel & Hep B Surface Ab     Status: None   Collection Time: 04/19/14  3:24 PM  Result Value Ref Range   Hepatitis B Surface Ag NEGATIVE NEGATIVE   Hep B C IgM NON REACTIVE NON REACTIVE    Comment: High levels of Hepatitis B Core IgM antibody are detectable during the acute stage of Hepatitis B. This antibody is  used to differentiate current from past HBV infection.      Hep B S Ab NEG NEGATIVE   Hep A IgM NON REACTIVE NON REACTIVE   HCV Ab NEGATIVE NEGATIVE    Assessment/Plan: Acute purulent otitis media Rx Amoxicillin suspension as patient refusing tablet.  Rx Flonase.  Supportive measures discussed with patient.

## 2015-06-13 IMAGING — CR DG LUMBAR SPINE COMPLETE 4+V
5 series · 5 of 5 positions shown · non-contrast
Comparison: None.

CLINICAL DATA: Chronic. Low back pain with worsening symptoms over
the past 3 months; no history of injury

EXAM:
LUMBAR SPINE - COMPLETE 4+ VIEW

[t l-spine a.p.]
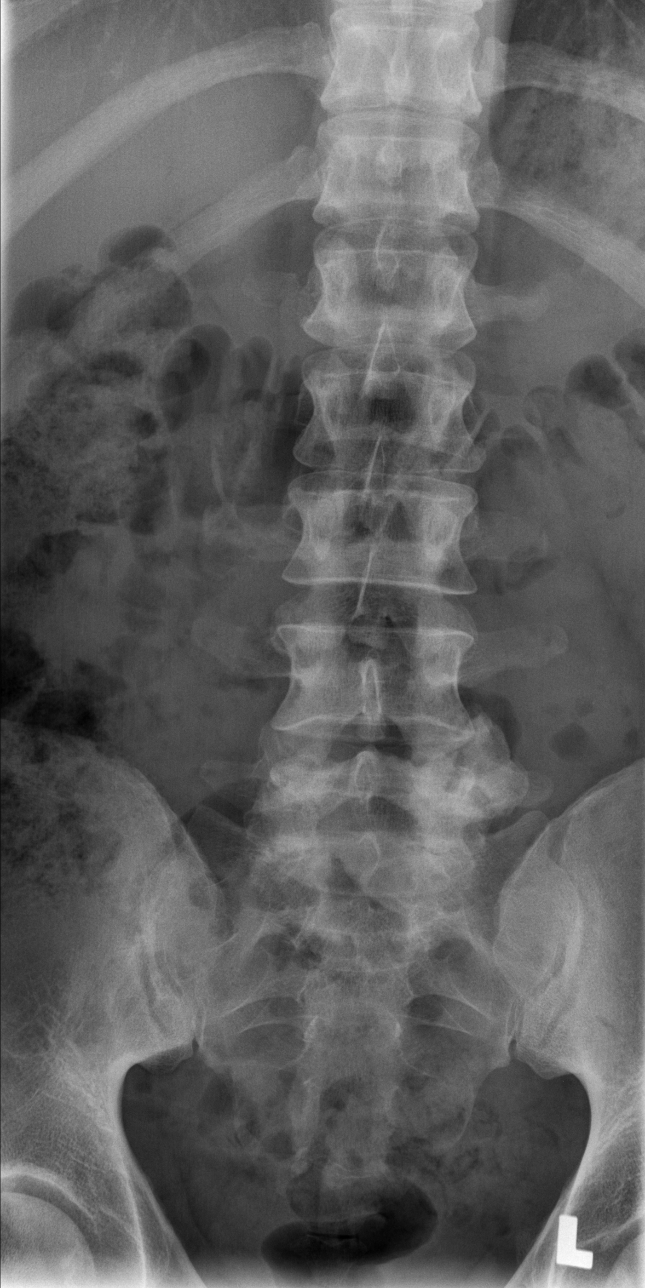

[t l-spine oblique exposure (1 of 2)]
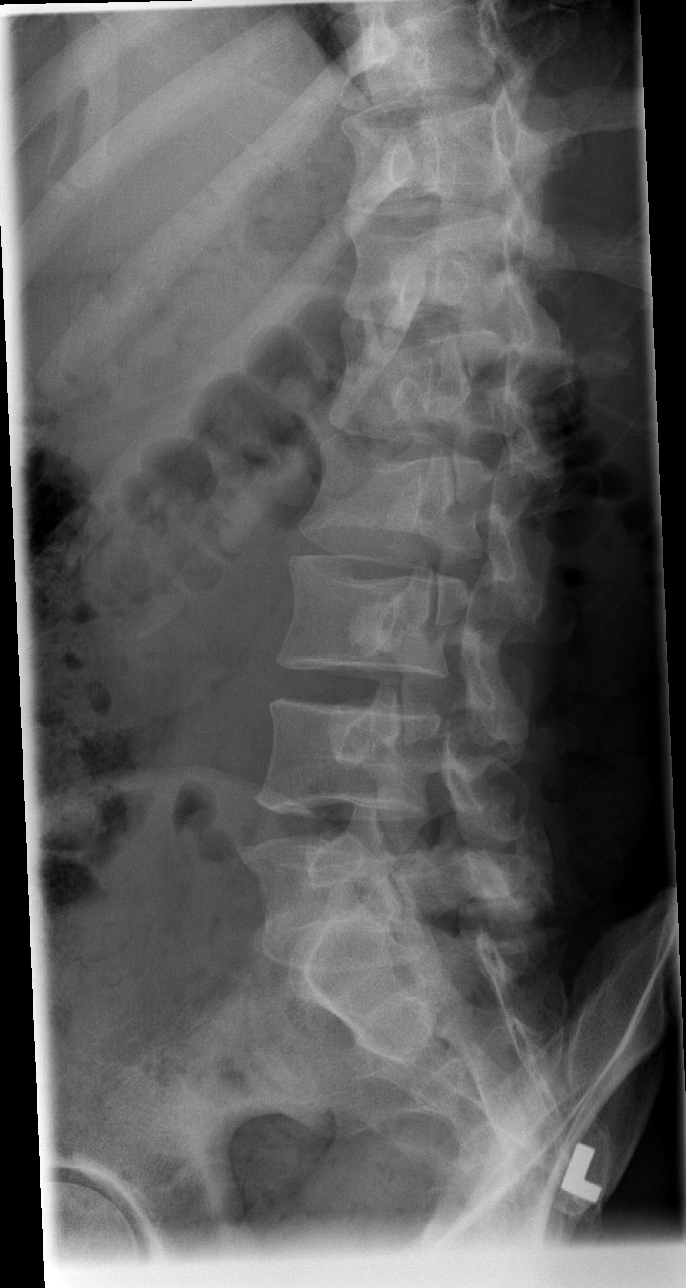

[t l-spine oblique exposure (2 of 2)]
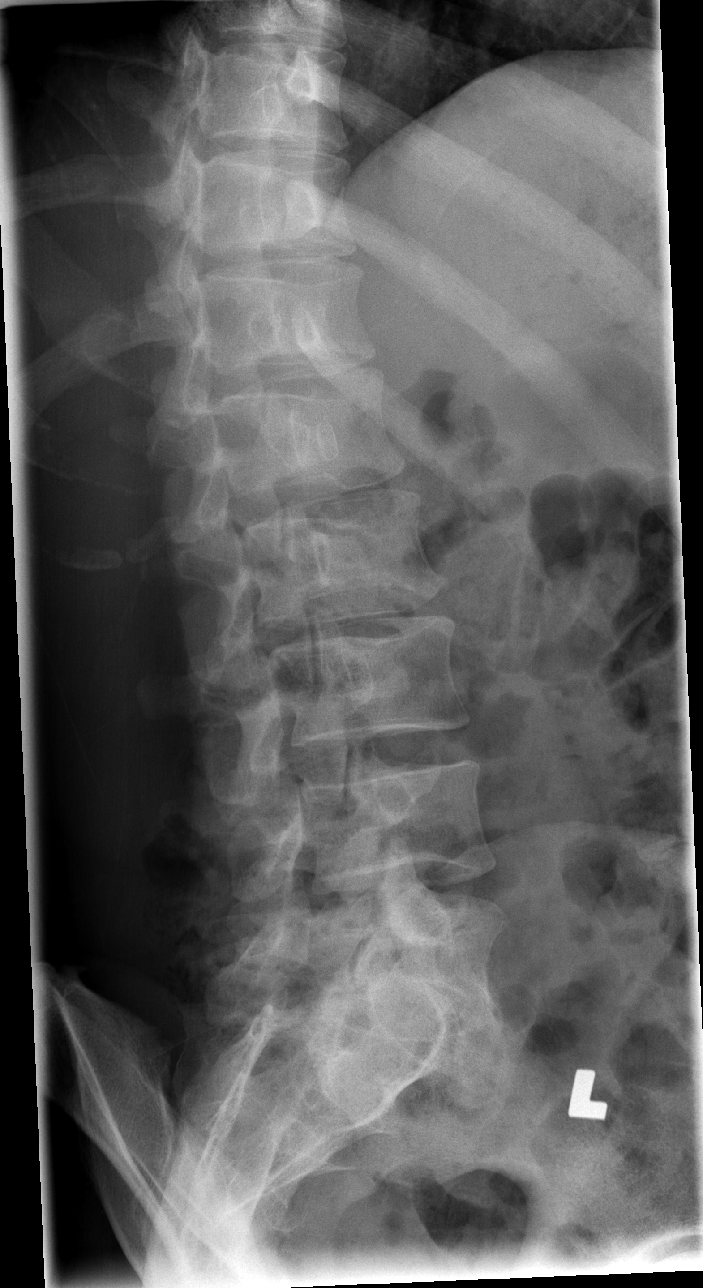

[t l-spine lat]
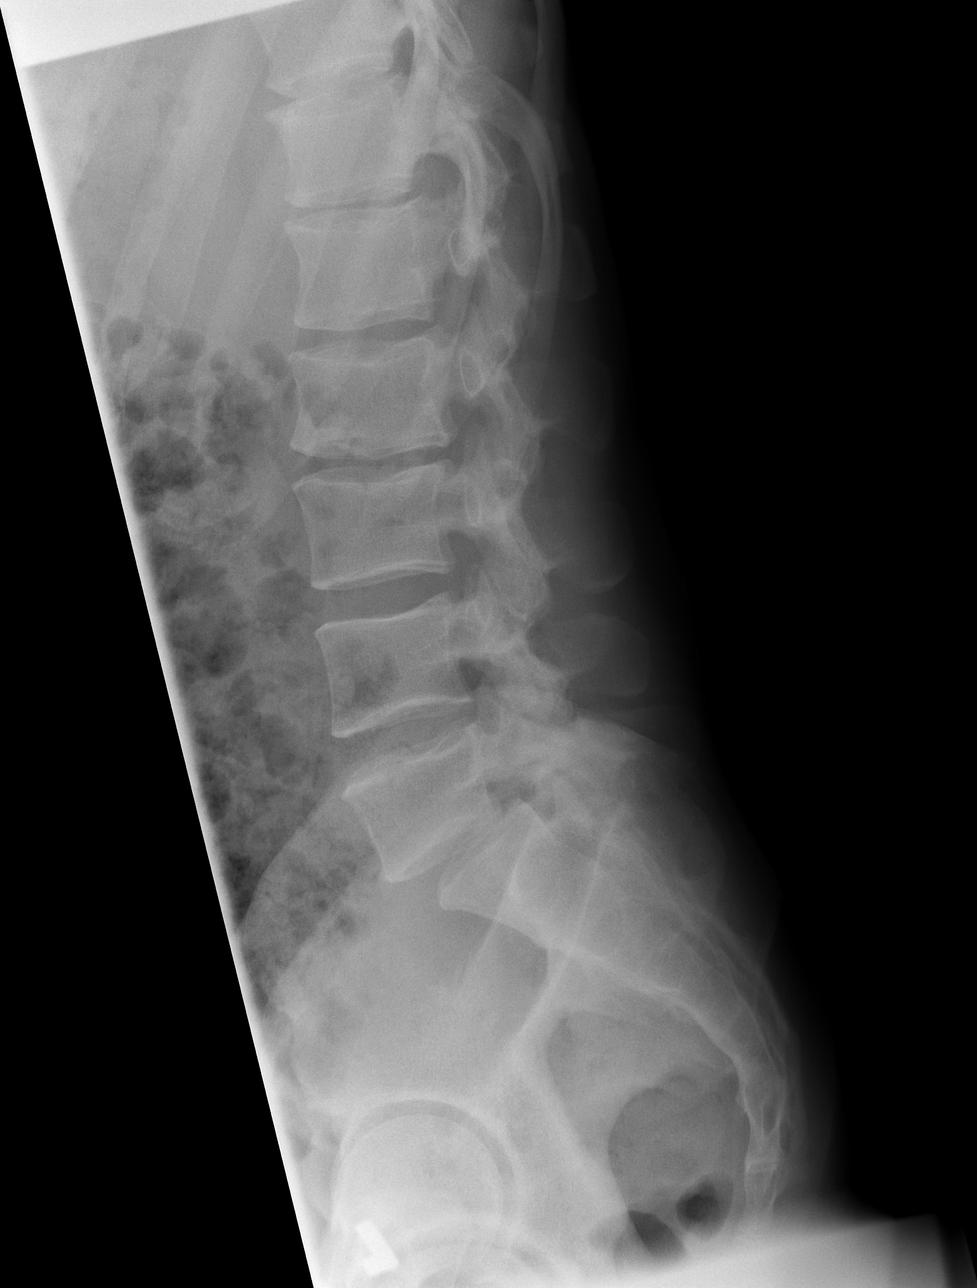

[t l-spine l5-s1 spot]
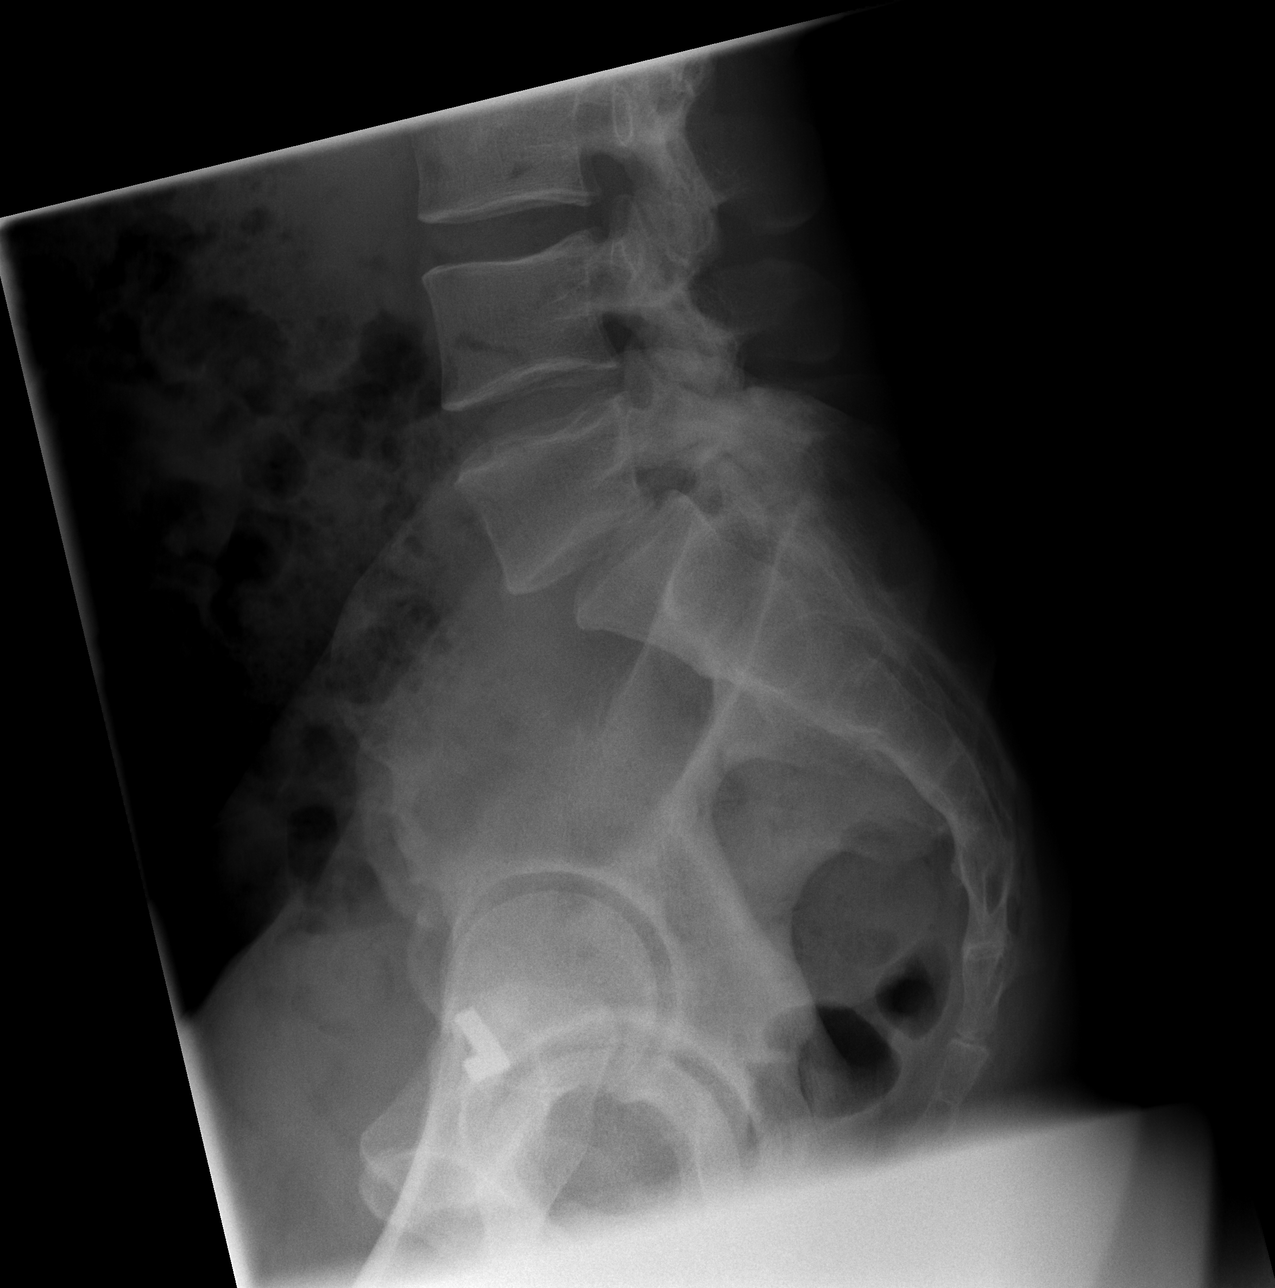

[5 of 5 positions shown; findings below may reference images not displayed]

FINDINGS: The lumbar vertebral bodies are preserved in height. The
intervertebral disc spaces are well maintained. There is grade 1
anterolisthesis of L5 with respect S1. There is facet joint
hypertrophy at L4-5 and L5-S1. Definite pars defects are not
demonstrated. The pedicles and transverse processes are intact. The
observed portions of the sacrum are normal.
IMPRESSION: Grade 1 anterolisthesis of L5 with respect S1 likely on the basis of
facet joint hypertrophy and degenerative disc change. There is no
acute compression fracture.

## 2015-06-13 IMAGING — CR DG WRIST COMPLETE 3+V*R*
4 series · 4 of 4 positions shown · non-contrast
Comparison: None.

CLINICAL DATA: Chronic right hand and wrist pain ; no recent injury

EXAM:
RIGHT WRIST - COMPLETE 3+ VIEW

[x wrist pa right]
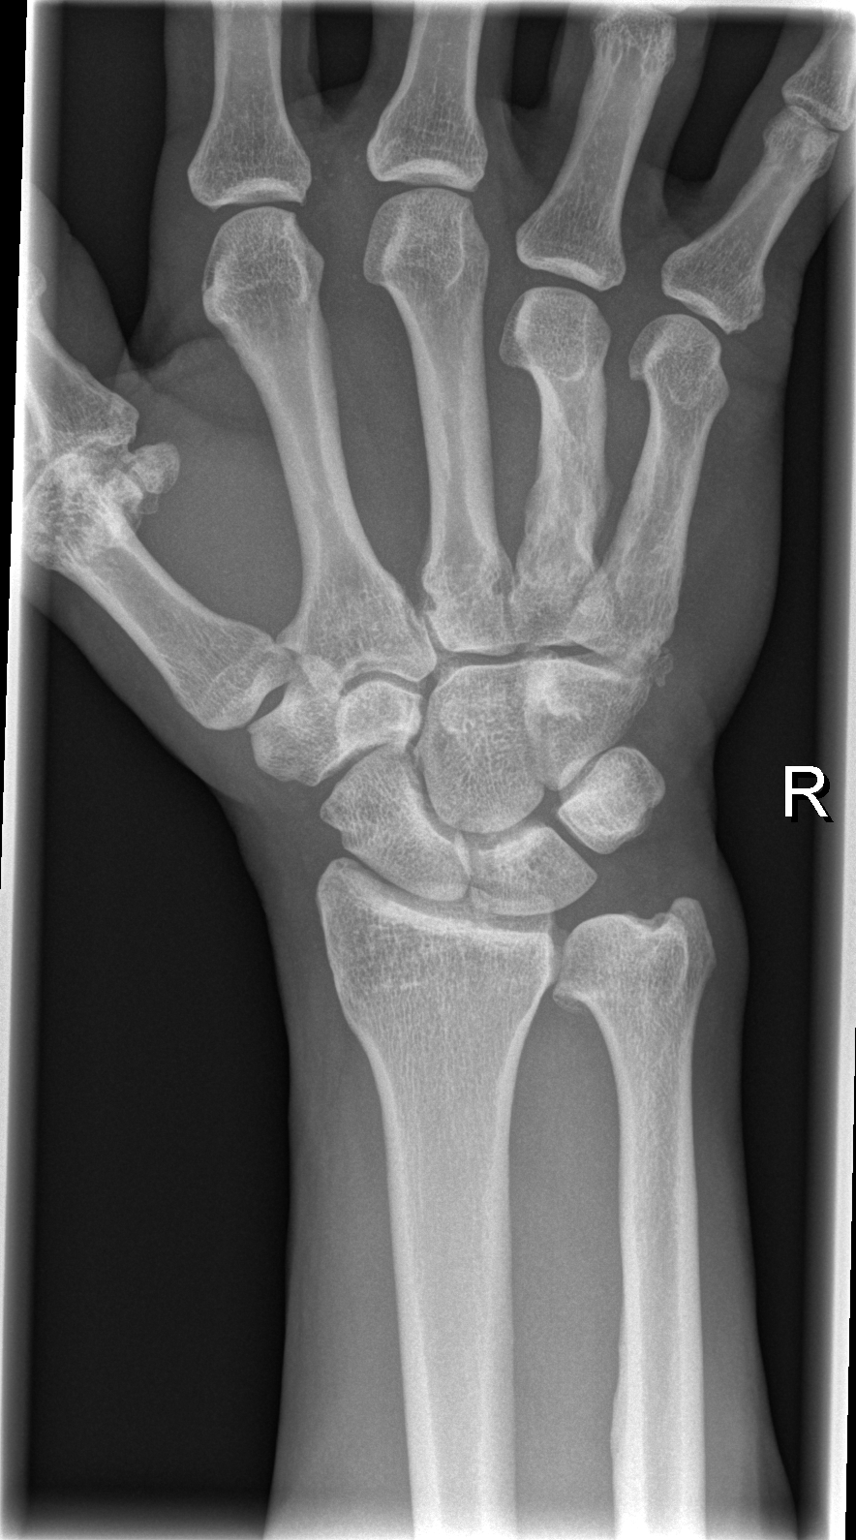

[x wrist obl right]
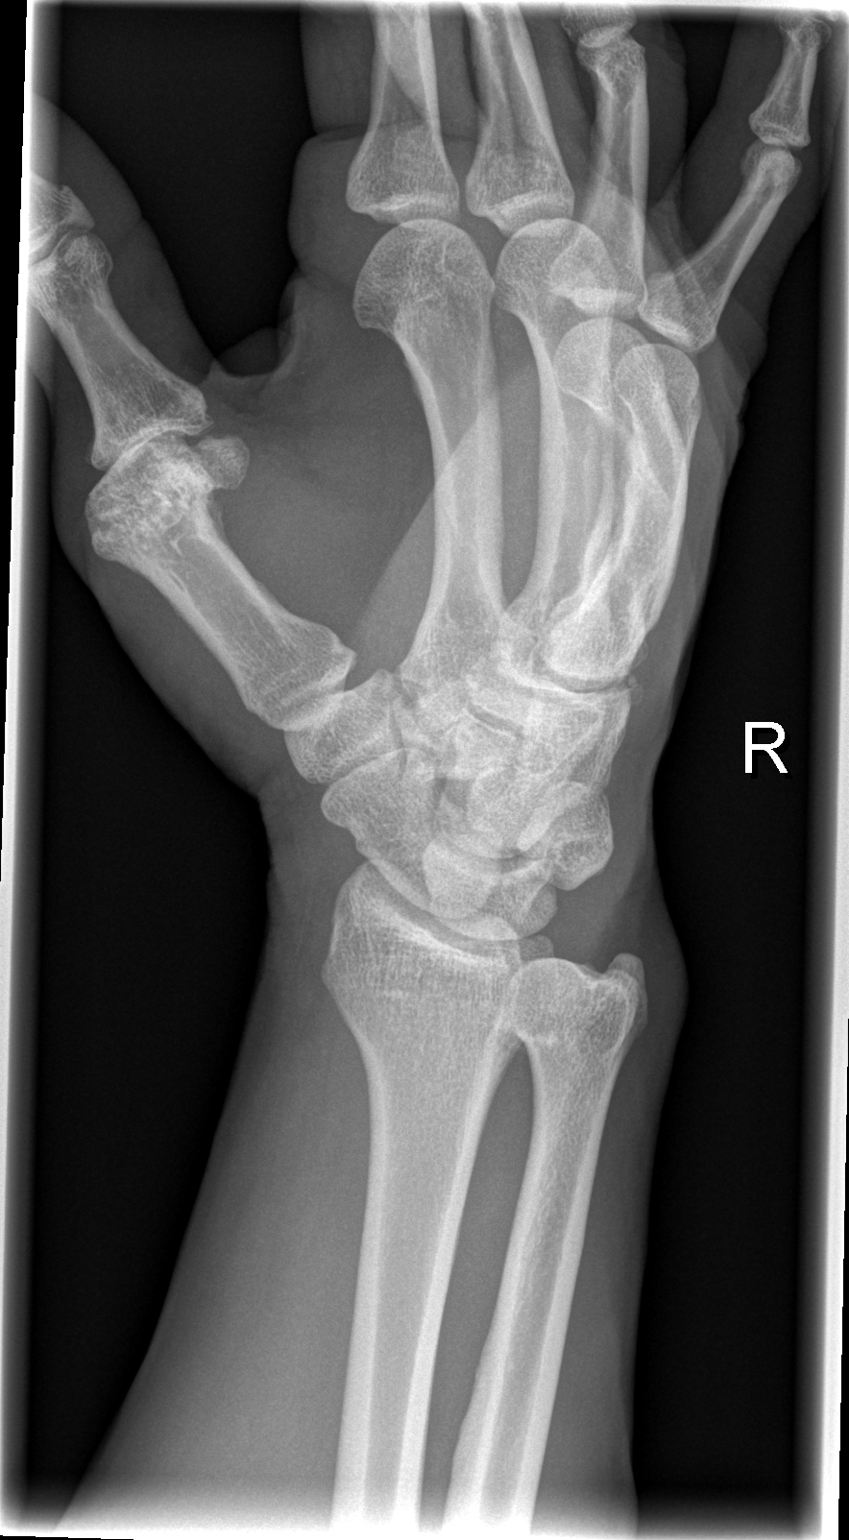

[x wrist lat right]
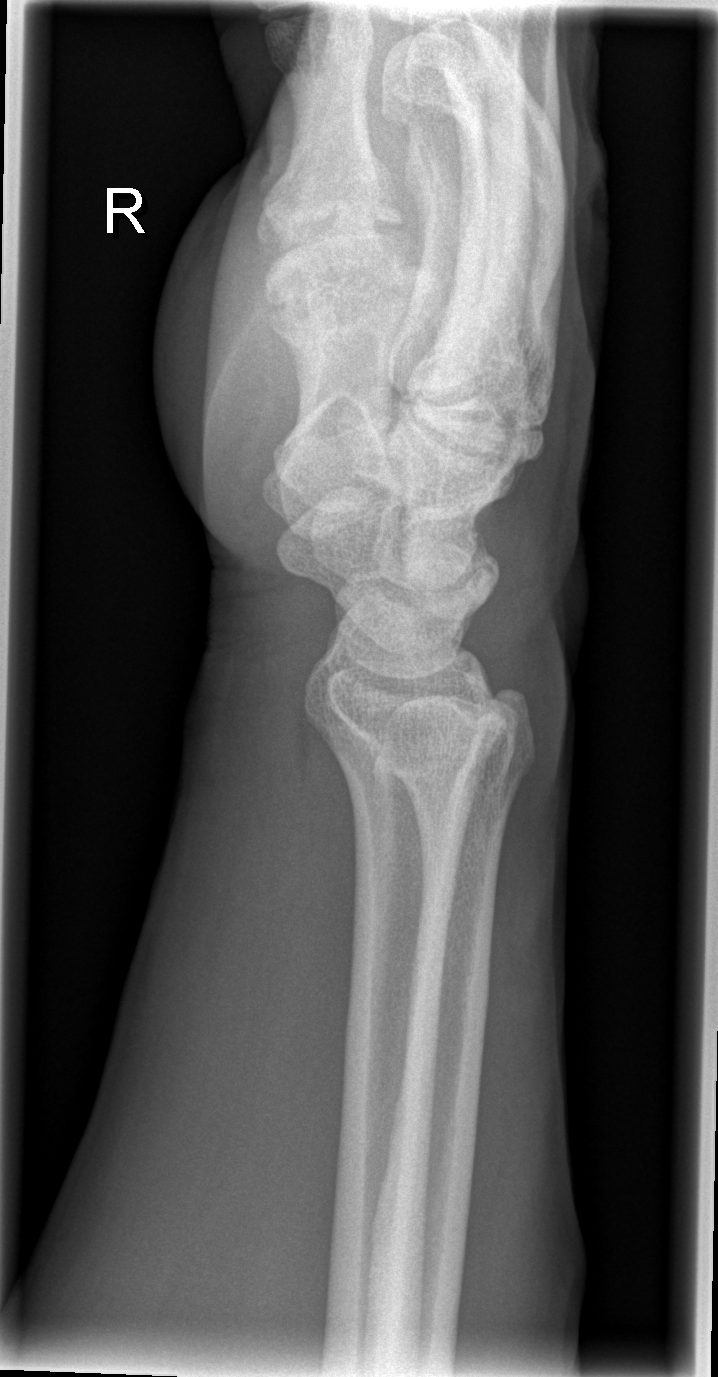

[x navicular]
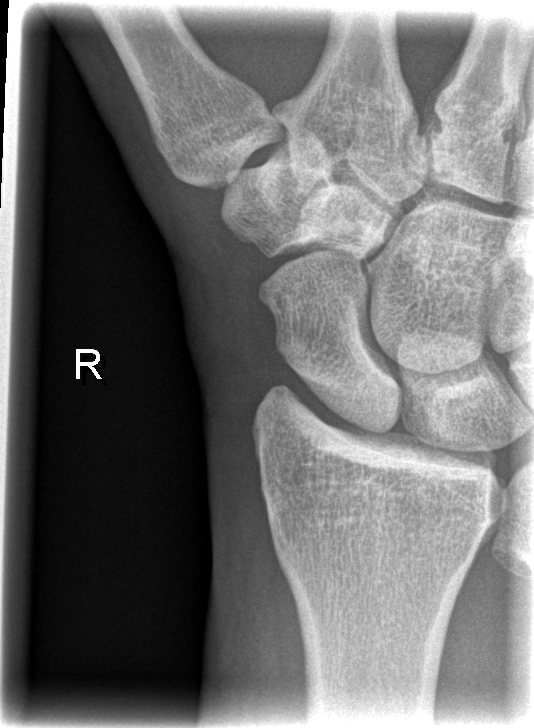

[4 of 4 positions shown; findings below may reference images not displayed]

FINDINGS: The bones of the wrist are adequately mineralized. There is contour
deformity of the proximal [DATE] of the shaft of the fourth metacarpal
which likely reflects previous trauma. There is mild degenerative
change of the fifth carpometacarpal joint. The other carpometacarpal
joints are intact. The intercarpal and radiocarpal joints are
normal. The triangular fibrocartilage region is unremarkable. There
is moderate osteoarthritic change of the first metacarpophalangeal
joint that is only partially included in the field of view.
IMPRESSION: 1. There is no acute or significant chronic bony abnormality of the
distal radius and ulna nor of the carpal bones.
2. There is mild degenerative change of the fifth carpometacarpal
joint and there is old deformity of the proximal shaft of the fourth
metacarpal that is likely post traumatic.
3. Moderate osteoarthritic change of the first metacarpophalangeal
joint.

## 2019-10-18 ENCOUNTER — Other Ambulatory Visit: Payer: Self-pay

## 2019-10-18 ENCOUNTER — Encounter (HOSPITAL_BASED_OUTPATIENT_CLINIC_OR_DEPARTMENT_OTHER): Payer: Self-pay

## 2019-10-18 DIAGNOSIS — J383 Other diseases of vocal cords: Secondary | ICD-10-CM | POA: Insufficient documentation

## 2019-10-18 DIAGNOSIS — Z5321 Procedure and treatment not carried out due to patient leaving prior to being seen by health care provider: Secondary | ICD-10-CM | POA: Diagnosis not present

## 2019-10-18 DIAGNOSIS — R05 Cough: Secondary | ICD-10-CM | POA: Insufficient documentation
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
## 2019-10-19 ENCOUNTER — Emergency Department (HOSPITAL_BASED_OUTPATIENT_CLINIC_OR_DEPARTMENT_OTHER)
Admission: EM | Admit: 2019-10-19 | Discharge: 2019-10-19 | Disposition: A | Discharge status: Home / Self Care | Payer: BLUE CROSS/BLUE SHIELD | Attending: Emergency Medicine | Admitting: Emergency Medicine

## 2019-10-19 NOTE — ED Notes (Signed)
No answer when called for room assignment  

## 2023-02-12 ENCOUNTER — Telehealth (HOSPITAL_COMMUNITY): Payer: Self-pay | Admitting: Licensed Clinical Social Worker

## 2023-02-12 NOTE — Telephone Encounter (Signed)
The therapist attempts to reach [employer] in relation to a referral that that was recently sent to him via the referral queue which was initiated in September of this year.  Therapist leaves a HIPAA compliant voicemail with his direct contact number which is [phone].  Myrna Blazer, MA, LCSW, [facility], LCAS 02/12/2023
